# Patient Record
Sex: Male | Born: 1955 | Race: White | Hispanic: No | Marital: Married | Smoking: Former smoker
Health system: Southern US, Community
[De-identification: ages and names within clinical notes are randomized; demographics above are authoritative.]

## PROBLEM LIST (undated history)

## (undated) DIAGNOSIS — K429 Umbilical hernia without obstruction or gangrene: Secondary | ICD-10-CM

## (undated) DIAGNOSIS — Z9189 Other specified personal risk factors, not elsewhere classified: Secondary | ICD-10-CM

## (undated) DIAGNOSIS — K603 Anal fistula, unspecified: Secondary | ICD-10-CM

## (undated) DIAGNOSIS — K4020 Bilateral inguinal hernia, without obstruction or gangrene, not specified as recurrent: Secondary | ICD-10-CM

## (undated) DIAGNOSIS — I10 Essential (primary) hypertension: Secondary | ICD-10-CM

## (undated) HISTORY — PX: NASAL SINUS SURGERY: SHX719

## (undated) HISTORY — DX: Essential (primary) hypertension: I10

---

## 1998-01-02 ENCOUNTER — Encounter: Payer: Self-pay | Admitting: *Deleted

## 1998-01-02 ENCOUNTER — Inpatient Hospital Stay (HOSPITAL_COMMUNITY): Admission: EM | Admit: 1998-01-02 | Discharge: 1998-01-12 | Payer: Self-pay | Admitting: *Deleted

## 1998-01-03 ENCOUNTER — Encounter: Payer: Self-pay | Admitting: *Deleted

## 1998-01-04 ENCOUNTER — Encounter: Payer: Self-pay | Admitting: Surgery

## 1998-01-09 ENCOUNTER — Encounter: Payer: Self-pay | Admitting: *Deleted

## 1998-02-04 ENCOUNTER — Ambulatory Visit (HOSPITAL_COMMUNITY): Admission: RE | Admit: 1998-02-04 | Discharge: 1998-02-04 | Payer: Self-pay | Admitting: *Deleted

## 1998-02-04 ENCOUNTER — Encounter: Payer: Self-pay | Admitting: *Deleted

## 2007-01-22 HISTORY — PX: INCISION AND DRAINAGE PERIRECTAL ABSCESS: SHX1804

## 2013-04-14 ENCOUNTER — Encounter (HOSPITAL_BASED_OUTPATIENT_CLINIC_OR_DEPARTMENT_OTHER): Payer: Self-pay | Admitting: Emergency Medicine

## 2013-04-14 DIAGNOSIS — Z872 Personal history of diseases of the skin and subcutaneous tissue: Secondary | ICD-10-CM | POA: Insufficient documentation

## 2013-04-14 DIAGNOSIS — Z87891 Personal history of nicotine dependence: Secondary | ICD-10-CM | POA: Insufficient documentation

## 2013-04-14 DIAGNOSIS — I10 Essential (primary) hypertension: Secondary | ICD-10-CM | POA: Insufficient documentation

## 2013-04-14 DIAGNOSIS — IMO0002 Reserved for concepts with insufficient information to code with codable children: Secondary | ICD-10-CM | POA: Insufficient documentation

## 2013-04-14 DIAGNOSIS — F411 Generalized anxiety disorder: Secondary | ICD-10-CM | POA: Insufficient documentation

## 2013-04-14 DIAGNOSIS — R209 Unspecified disturbances of skin sensation: Secondary | ICD-10-CM | POA: Insufficient documentation

## 2013-04-14 LAB — URINALYSIS, ROUTINE W REFLEX MICROSCOPIC
Bilirubin Urine: NEGATIVE
Glucose, UA: NEGATIVE mg/dL
Hgb urine dipstick: NEGATIVE
Ketones, ur: 15 mg/dL — AB
Leukocytes, UA: NEGATIVE
Nitrite: NEGATIVE
Protein, ur: NEGATIVE mg/dL
Specific Gravity, Urine: 1.036 — ABNORMAL HIGH (ref 1.005–1.030)
Urobilinogen, UA: 0.2 mg/dL (ref 0.0–1.0)
pH: 5.5 (ref 5.0–8.0)

## 2013-04-14 NOTE — ED Notes (Signed)
Tingling and numbness in groin area and rectal pain x1 week.  Getting worse and lasting longer.  Denies any urinary sx.

## 2013-04-15 ENCOUNTER — Emergency Department (HOSPITAL_BASED_OUTPATIENT_CLINIC_OR_DEPARTMENT_OTHER)
Admission: EM | Admit: 2013-04-15 | Discharge: 2013-04-15 | Disposition: A | Discharge status: Home / Self Care | Payer: No Typology Code available for payment source | Attending: Emergency Medicine | Admitting: Emergency Medicine

## 2013-04-15 DIAGNOSIS — R202 Paresthesia of skin: Secondary | ICD-10-CM

## 2013-04-15 DIAGNOSIS — M5410 Radiculopathy, site unspecified: Secondary | ICD-10-CM

## 2013-04-15 MED ORDER — NAPROXEN 250 MG PO TABS
500.0000 mg | ORAL_TABLET | Freq: Two times a day (BID) | ORAL | Status: DC
  Administered 2013-04-15: 500 mg via ORAL
  Filled 2013-04-15: qty 2

## 2013-04-15 MED ORDER — NAPROXEN 500 MG PO TABS: 500.0000 mg | ORAL_TABLET | Freq: Two times a day (BID) | ORAL | Status: DC

## 2013-04-15 MED ORDER — METHYLPREDNISOLONE 4 MG PO KIT: PACK | ORAL | Status: DC

## 2013-04-15 NOTE — Discharge Instructions (Signed)
Paresthesia Paresthesia is an abnormal burning or prickling sensation. This sensation is generally felt in the hands, arms, legs, or feet. However, it may occur in any part of the body. It is usually not painful. The feeling may be described as:  Tingling or numbness.  "Pins and needles."  Skin crawling.  Buzzing.  Limbs "falling asleep."  Itching. Most people experience temporary (transient) paresthesia at some time in their lives. CAUSES  Paresthesia may occur when you breathe too quickly (hyperventilation). It can also occur without any apparent cause. Commonly, paresthesia occurs when pressure is placed on a nerve. The feeling quickly goes away once the pressure is removed. For some people, however, paresthesia is a long-lasting (chronic) condition caused by an underlying disorder. The underlying disorder may be:  A traumatic, direct injury to nerves. Examples include a:  Broken (fractured) neck.  Fractured skull.  A disorder affecting the brain and spinal cord (central nervous system). Examples include:  Transverse myelitis.  Encephalitis.  Transient ischemic attack.  Multiple sclerosis.  Stroke.  Tumor or blood vessel problems, such as an arteriovenous malformation pressing against the brain or spinal cord.  A condition that damages the peripheral nerves (peripheral neuropathy). Peripheral nerves are not part of the brain and spinal cord. These conditions include:  Diabetes.  Peripheral vascular disease.  Nerve entrapment syndromes, such as carpal tunnel syndrome.  Shingles.  Hypothyroidism.  Vitamin B12 deficiencies.  Alcoholism.  Heavy metal poisoning (lead, arsenic).  Rheumatoid arthritis.  Systemic lupus erythematosus. DIAGNOSIS  Your caregiver will attempt to find the underlying cause of your paresthesia. Your caregiver may:  Take your medical history.  Perform a physical exam.  Order various lab tests.  Order imaging tests. TREATMENT    Treatment for paresthesia depends on the underlying cause. HOME CARE INSTRUCTIONS  Avoid drinking alcohol.  You may consider massage or acupuncture to help relieve your symptoms.  Keep all follow-up appointments as directed by your caregiver. SEEK IMMEDIATE MEDICAL CARE IF:   You feel weak.  You have trouble walking or moving.  You have problems with speech or vision.  You feel confused.  You cannot control your bladder or bowel movements.  You feel numbness after an injury.  You faint.  Your burning or prickling feeling gets worse when walking.  You have pain, cramps, or dizziness.  You develop a rash. MAKE SURE YOU:  Understand these instructions.  Will watch your condition.  Will get help right away if you are not doing well or get worse. Document Released: 12/28/2001 Document Revised: 04/01/2011 Document Reviewed: 09/28/2010 [facility] Patient Information 2014 [place], [place].  Radicular Pain Radicular pain in either the arm or leg is usually from a bulging or herniated disk in the spine. A piece of the herniated disk may press against the nerves as the nerves exit the spine. This causes pain which is felt at the tips of the nerves down the arm or leg. Other causes of radicular pain may include:  Fractures.  Heart disease.  Cancer.  An abnormal and usually degenerative state of the nervous system or nerves (neuropathy). Diagnosis may require CT or MRI scanning to determine the primary cause.  Nerves that start at the neck (nerve roots) may cause radicular pain in the outer shoulder and arm. It can spread down to the thumb and fingers. The symptoms vary depending on which nerve root has been affected. In most cases radicular pain improves with conservative treatment. Neck problems may require physical therapy, a neck collar, or  cervical traction. Treatment may take many weeks, and surgery may be considered if the symptoms do not improve.  Conservative treatment  is also recommended for sciatica. Sciatica causes pain to radiate from the lower back or buttock area down the leg into the foot. Often there is a history of back problems. Most patients with sciatica are better after 2 to 4 weeks of rest and other supportive care. Short term bed rest can reduce the disk pressure considerably. Sitting, however, is not a good position since this increases the pressure on the disk. You should avoid bending, lifting, and all other activities which make the problem worse. Traction can be used in severe cases. Surgery is usually reserved for patients who do not improve within the first months of treatment. Only take over-the-counter or prescription medicines for pain, discomfort, or fever as directed by your caregiver. Narcotics and muscle relaxants may help by relieving more severe pain and spasm and by providing mild sedation. Cold or massage can give significant relief. Spinal manipulation is not recommended. It can increase the degree of disc protrusion. Epidural steroid injections are often effective treatment for radicular pain. These injections deliver medicine to the spinal nerve in the space between the protective covering of the spinal cord and back bones (vertebrae). Your caregiver can give you more information about steroid injections. These injections are most effective when given within two weeks of the onset of pain.  You should see your caregiver for follow up care as recommended. A program for neck and back injury rehabilitation with stretching and strengthening exercises is an important part of management.  SEEK IMMEDIATE MEDICAL CARE IF:  You develop increased pain, weakness, or numbness in your arm or leg.  You develop difficulty with bladder or bowel control.  You develop abdominal pain. Document Released: 02/15/2004 Document Revised: 04/01/2011 Document Reviewed: 05/02/2008 [facility] Patient Information 2014 [place], [place].   Emergency Department  Resource Guide 1) Find a Doctor and Pay Out of Pocket Although you won't have to find out who is covered by your insurance plan, it is a good idea to ask around and get recommendations. You will then need to call the office and see if the doctor you have chosen will accept you as a new patient and what types of options they offer for patients who are self-pay. Some doctors offer discounts or will set up payment plans for their patients who do not have insurance, but you will need to ask so you aren't surprised when you get to your appointment.  2) Contact Your Local Health Department Not all health departments have doctors that can see patients for sick visits, but many do, so it is worth a call to see if yours does. If you don't know where your local health department is, you can check in your phone book. The CDC also has a tool to help you locate your state's health department, and many state websites also have listings of all of their local health departments.  3) Find a Walk-in Clinic If your illness is not likely to be very severe or complicated, you may want to try a walk in clinic. These are popping up all over the country in pharmacies, drugstores, and shopping centers. They're usually staffed by nurse practitioners or physician assistants that have been trained to treat common illnesses and complaints. They're usually fairly quick and inexpensive. However, if you have serious medical issues or chronic medical problems, these are probably not your best option.  No Primary Care  Doctor: - Call Health Connect at  [phone] - they can help you locate a primary care doctor that  accepts your insurance, provides certain services, etc. - Physician Referral Service- [phone]  Chronic Pain Problems: Organization         Address  Phone   Notes  [facility]  [phone] Patients need to be referred by their primary care doctor.   Medication Assistance: Organization          Address  Phone   Notes  [facility] Medication [facility] [address] [place] [place], [place] [address] [phone] --Must be a resident of [facility] -- Must have NO insurance coverage whatsoever (no Medicaid/ Medicare, etc.) -- The pt. MUST have a primary care doctor that directs their care regularly and follows them in the community   [facility]  [phone]   [employer]  [phone]    Agencies that provide inexpensive medical care: Organization         Address  Phone   Notes  [facility]  [phone]   [facility]    [phone]   [facility] [address] [place], [place] [address] [phone]   [facility] [address] [place], [place] [phone]   Planned Parenthood    [phone]   [facility]    [phone]   [facility]  [address], [place] Phone:  [phone], Fax:  [phone] Hours of Operation:  9 am - 6 pm, M-F.  Also accepts Medicaid/Medicare and self-pay.  [facility]  [address], [place] Phone: [phone], Fax: [phone]. Hours of Operation:  8:30 am - 5:30 pm, M-F.  Also accepts Medicaid and self-pay.  [facility] [place] [address], [place] Phone: [phone]   [facility] [address] Natasha Bence [place], [place] [phone], Ext. [phone] Mondays & Thursdays: 7-9 AM.  First 15 patients are seen on a first come, first serve basis.    Medicaid-accepting [facility] Providers:  Organization         Address  Phone   Notes  [facility] [address], [place] [phone] Also accepts self-pay patients.  [facility] [address] Laurell Josephs [place], [place]  [phone]   [facility] [address], [place] [phone]   [facility] [address] [place] [phone]   Renaye Rakers [address], [place]   [phone] Only accepts [place] patients after they have their name applied to their card.   Self-Pay (no insurance) in [facility]:  Organization         Address  Phone   Notes  Sickle Cell Patients, [facility] [address] [place], [place] [phone]   [facility] [address] [place], [place] [phone]   [facility] [place]  [address],  [phone]   [facility]/Dr. Osei-Bonsu  [address], [place] or [address], [place] [phone] Phone number for both [place] and [place] locations is the same.  [facility] [address], [place] Otto [phone]   [facility] [address], [place] or [address] [phone]   [facility] [address], [place] [phone], phone; [phone], fax Sees patients 1st and 3rd Saturday of every month.  Must not qualify for public or private insurance (i.e. Medicaid, Medicare, [place] Health Choice, Veterans' Benefits)  Household income should be no more than 200% of the poverty level The clinic cannot treat you if you are pregnant or think you are pregnant  Sexually transmitted diseases are not treated at the clinic.    Dental Care: Organization         Address  Phone  Notes  [facility] of [facility] [address] [place], [place] [phone] Accepts children up to age 60 who are enrolled in [place] or [place] Health Choice; pregnant women with a Medicaid card; and children who have applied for Medicaid or [place] Health Choice, but were declined, whose parents can pay a reduced fee at time of service.  [facility] Department of [facility]  [address], [place] [phone] Accepts children up to age 33 who are enrolled in [place] or [place] Health Choice; pregnant women with a Medicaid card; and children who have applied for Medicaid or [place] Health Choice, but were declined, whose parents can pay a reduced fee at time of service.  [facility]  [address] [place], [place] [phone] Patients are seen by appointment only. Walk-ins are not accepted. [facility] will see patients 23 years of age and older. Monday - Tuesday (8am-5pm) Most Wednesdays (8:30-5pm) $30 per visit, cash only  [facility] Adult Dental Access [facility]  [address], [facility] [phone] Patients are seen by appointment only. Walk-ins are not accepted. [facility] will see patients 43 years of age and older. One Wednesday Evening (Monthly: Volunteer Based).  $30 per visit, cash only  [employer]  [phone] for adults; Children under age 24, call Graduate Pediatric Dentistry at [phone]. Children aged 58-14, please call [phone] to request a pediatric application.  Dental services are provided in all areas of dental care including fillings, crowns and bridges, complete and partial dentures, implants, gum treatment, root canals, and extractions. Preventive care is also provided. Treatment is provided to both adults and children. Patients are selected via a lottery and there is often a waiting list.   [facility] [address] [place]  [phone] [email or web address]   [facility] [address] [place], [place] [phone], Ext. [phone] Second and Fourth Thursday of each month, opens at 6:30 AM; Clinic ends at 9 AM.  Patients are seen on a first-come first-served basis, and a limited number are seen during each clinic.   [facility]  [address] Ether Griffins [place], [place] [phone]   Eligibility Requirements You must  have lived in [place], [place], or [place] counties for at least the last three months.   You cannot be eligible for state or federal sponsored [place], including [employer], [place], or [employer].   You generally cannot be eligible for healthcare insurance through your employer.    How to apply: Eligibility screenings are held every Tuesday and Wednesday afternoon from 1:00 pm until 4:00 pm. You do not need an appointment for the interview!  [facility] [address] [place], [place] [phone]   Aaron Edelman  [facility] Health Department  [phone]   [facility]  [phone]   [facility]  [phone]    Behavioral Health Resources in the Community: Intensive Outpatient Programs Organization         Address  Phone  Notes  [facility] [address], [place], [place] [phone]   [facility] [address], [place], [place] [phone]   [facility] [address], [place], [place]  [phone]   [facility] [address],  [place], [place] [phone] or [phone]   Substance Abuse Resources Organization         Address  Phone  Notes  [facility]  [phone]   [facility]  [phone]   [facility]  [phone]   [facility]  [phone]   Residential & Outpatient Substance Abuse Program  [phone]   Psychological Services Organization         Address  Phone  Notes  [facility]  [phone]- [phone]   [facility]  [phone]- [phone]   [facility] [address], [place] [phone] or [phone]    Mobile Crisis Teams Organization         Address  Phone  Notes  [facility], Mobile Crisis Care Unit  [phone]   [facility]  [address] [place], [place] [phone]   Doristine LocksSharon  DeEsch [address] [place] [phone]    Self-Help/Support Groups Organization         Address  Phone             Notes  [facility] [place] - variety of support groups  336- [phone] Call for more information  Narcotics Anonymous (NA), [facility] [address], [place]  2 meetings at this location   Statisticianesidential Treatment Programs Organization         Address  Phone  Notes  [facility] [address],    [place]  [phone]   [facility]  [address], [place] [phone], [place], [place] [phone]   [facility] [address], [place] [phone] Admissions: 8am-3pm M-F  [facility] [address]    [place], [place] [phone]   [facility] [address], [place], [place] [phone]   [facility] [address]  [place], [place] [phone]   [facility] - Intensive Outpatient [address], [place], [place] [phone]   [facility] ([facility] [address]  [place], [place] [phone] or [phone]   [facility] ([facility]) [address] [place], [place] [phone] Accepts Medicaid  [facility] [address]  [place] [phone] Substance Abuse/Addiction Treatment   [facility] Resources Organization         Address  Phone  Notes  [facility]  [phone]) [phone]   Angie FavaJulie Brannon, PhD [address], [address] [place], [place]   [phone] or [phone]   [facility]    [address] [place], [place] [phone](336) [phone]   [facility] [address], [place], [place] [phone](336) [phone] Insurance/Medicaid/sponsorship through [employer] [facility]  [facility] Families [address]  [place], [place] [phone] [place], [place] [phone]    Dr. Adele Schilder  [phone]   [facility] Dept. 1) [address] [place] 2) [place] 3)  [address], [place] [phone]  [phone]   [place] [phone] or [phone] (After Hours)

## 2013-04-15 NOTE — ED Provider Notes (Signed)
CSN: [id number]     Arrival date & time 04/14/13  2315 History   First MD Initiated Contact with Patient 04/15/13 0134     Chief Complaint  Patient presents with  . Groin Pain     (Consider location/radiation/quality/duration/timing/severity/associated sxs/prior Treatment) HPI 58 year old male presents to emergency department with complaint of a tingling sensation in his right groin, extending down his right leg, occasionally in his penis and scrotal area.  Sensation is a buzzing electrical numbness.  He occasionally has some pain associated with sensation.  Sensation occasionally comes from his back, radiates around and goes down into his groin and into his leg.  Patient also reports occasional rectal drainage of grayish white material.  He reports past history of a buttock abscess and perirectal abscess.  He reports that occasionally he will have drainage from the area of the previous abscess.  He denies any pain associated with this drainage.  No fevers or chills.  Patient does not have a primary care Dr. It is his birthday, and he was concerned about the possibility of having cancer as cause for his symptoms.  He was also worried that the abscess may be closed off and backing up inside his body, causing him to become ill.  No fever, chills.  Patient reports generalized bloating as well.  Patient is overall poor historian, is unable to pinpoint when the electrical sensation began, but he estimates 2-3 weeks ago.  He has not taken any medication prior to arrival.  The pain is worse with sitting for prolonged periods.  He denies any weakness in his right or left leg. History reviewed. No pertinent past medical history. History reviewed. No pertinent past surgical history. No family history on file. History  Substance Use Topics  . Smoking status: Former Games developermoker  . Smokeless tobacco: Never Used  . Alcohol Use: No    Review of Systems   See History of Present Illness; otherwise all other  systems are reviewed and negative  Allergies  Review of patient's allergies indicates no known allergies.  Home Medications  No current outpatient prescriptions on file. BP 162/103  Pulse 104  Temp(Src) 98.4 F (36.9 C) (Oral)  Resp 20  Ht 5\' 8"  (1.727 m)  Wt 210 lb (95.255 kg)  BMI 31.94 kg/m2  SpO2 94% Physical Exam  Nursing note and vitals reviewed. Constitutional: He is oriented to person, place, and time. He appears well-developed and well-nourished. He appears distressed (Anxious appearing).  HENT:  Head: Normocephalic and atraumatic.  Nose: Nose normal.  Mouth/Throat: Oropharynx is clear and moist.  Eyes: Conjunctivae and EOM are normal. Pupils are equal, round, and reactive to light.  Neck: Normal range of motion. Neck supple. No JVD present. No tracheal deviation present. No thyromegaly present.  Cardiovascular: Normal rate, regular rhythm, normal heart sounds and intact distal pulses.  Exam reveals no gallop and no friction rub.   No murmur heard. Hypertension noted  Pulmonary/Chest: Effort normal and breath sounds normal. No stridor. No respiratory distress. He has no wheezes. He has no rales. He exhibits no tenderness.  Abdominal: Soft. Bowel sounds are normal. He exhibits no distension and no mass. There is no tenderness. There is no rebound and no guarding.  Genitourinary: No penile tenderness.  No inguinal hernias noted.  Testicles are normal.  Penis is normal.  Exam performed, no perirectal or perianal abscess is noted, no abscesses, prostate is normal.  Musculoskeletal: Normal range of motion. He exhibits no edema and no tenderness.  Lymphadenopathy:  He has no cervical adenopathy.  Neurological: He is alert and oriented to person, place, and time. He has normal reflexes. No cranial nerve deficit. He exhibits normal muscle tone. Coordination normal.  Skin: Skin is warm and dry. No rash noted. No erythema. No pallor.  Psychiatric: He has a normal mood and  affect. His behavior is normal. Judgment and thought content normal.    ED Course  Procedures (including critical care time) Labs Review Labs Reviewed  URINALYSIS, ROUTINE W REFLEX MICROSCOPIC - Abnormal; Notable for the following:    Color, Urine AMBER (*)    Specific Gravity, Urine 1.036 (*)    Ketones, ur 15 (*)    All other components within normal limits   Imaging Review No results found.   EKG Interpretation None      MDM   Final diagnoses:  Radiculopathy  Paresthesia    58 year old male with possible radiculopathy as explanation for the sensation that he has in his right groin, extending down his right leg.  His exam is overall normal.  I do not see any active source of rectal or anal drainage or abscess.  Will have him followup with general surgery for further evaluation of his ongoing drainage.  We'll set him up to see a primary care Dr.  Roosvelt Harps to do a Medrol Dosepak and Naprosyn to help with his paresthesias    Olivia Mackie, MD 04/15/13 [id number]

## 2013-04-16 ENCOUNTER — Ambulatory Visit (INDEPENDENT_AMBULATORY_CARE_PROVIDER_SITE_OTHER): Payer: No Typology Code available for payment source | Admitting: Surgery

## 2013-04-16 ENCOUNTER — Encounter (INDEPENDENT_AMBULATORY_CARE_PROVIDER_SITE_OTHER): Payer: Self-pay | Admitting: Surgery

## 2013-04-16 VITALS — BP 170/76 | HR 80 | Temp 97.6°F | Resp 16 | Ht 69.0 in | Wt 215.0 lb

## 2013-04-16 DIAGNOSIS — K4020 Bilateral inguinal hernia, without obstruction or gangrene, not specified as recurrent: Secondary | ICD-10-CM | POA: Insufficient documentation

## 2013-04-16 DIAGNOSIS — K429 Umbilical hernia without obstruction or gangrene: Secondary | ICD-10-CM

## 2013-04-16 DIAGNOSIS — K603 Anal fistula: Secondary | ICD-10-CM

## 2013-04-16 NOTE — Patient Instructions (Signed)
We will schedule you to see Dr. 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  After that problem has resolved, I would like to see you back about your three hernias.

## 2013-04-16 NOTE — Progress Notes (Signed)
Patient ID: Daniel [name], male   DOB: [birth year], 58 y.o.   MRN: [id number]  Chief Complaint  Patient presents with  . Abscess    HPI Daniel [name] is a 58 y.o. male.  Referred by Dr. Norlene Campbelltter for evaluation of perirectal abscess/ fistula Abscess Associated symptoms: no fever, no headaches, no nausea and no vomiting    This is a 58 year old male who had a pararectal abscess drained in 2009 by Dr. Dwain SarnaWakefield. Over the last several months he has noticed some purulent drainage and occasional irritation and tenderness around his rectum.  He does not have a primary care physician and he went to the emergency department yesterday with complaints of intermittent twinges of pain in both groins which extended down into the testicles and penis. He denies any significant pain around his rectum at this time. He has not noticed any swelling or bulges in his groin. He was evaluated in the emergency department and was felt to have a possible radiculopathy. He was given a Medrol Dosepak.  He was asked to followup in our office for the rectal drainage.  The patient reports frequent constipation.    History reviewed. No pertinent past medical history.  History reviewed. No pertinent past surgical history.  Family History  Problem Relation Age of Onset  . Cancer Father     liver    Social History History  Substance Use Topics  . Smoking status: Former Games developermoker  . Smokeless tobacco: Never Used  . Alcohol Use: No    No Known Allergies  Current Outpatient Prescriptions  Medication Sig Dispense Refill  . methylPREDNISolone (MEDROL DOSEPAK) 4 MG tablet Take as directed on packet  21 tablet  0  . naproxen (NAPROSYN) 500 MG tablet Take 1 tablet (500 mg total) by mouth 2 (two) times daily.  30 tablet  0   No current facility-administered medications for this visit.    Review of Systems Review of Systems  Constitutional: Negative for fever, chills and unexpected weight change.  HENT: Negative for  congestion, hearing loss, sore throat, trouble swallowing and voice change.   Eyes: Negative for visual disturbance.  Respiratory: Negative for cough and wheezing.   Cardiovascular: Negative for chest pain, palpitations and leg swelling.  Gastrointestinal: Positive for constipation and rectal pain. Negative for nausea, vomiting, abdominal pain, diarrhea, blood in stool, abdominal distention and anal bleeding.  Genitourinary: Positive for testicular pain. Negative for hematuria and difficulty urinating.  Musculoskeletal: Negative for arthralgias.  Skin: Negative for rash and wound.  Neurological: Negative for seizures, syncope, weakness and headaches.  Hematological: Negative for adenopathy. Does not bruise/bleed easily.  Psychiatric/Behavioral: Negative for confusion.    Blood pressure 170/76, pulse 80, temperature 97.6 F (36.4 C), temperature source Oral, resp. rate 16, height 5\' 9"  (1.753 m), weight 215 lb (97.523 kg).  Physical Exam Physical Exam WDWN in NAD HEENT:  EOMI, sclera anicteric Neck:  No masses, no thyromegaly Lungs:  CTA bilaterally; normal respiratory effort CV:  Regular rate and rhythm; no murmurs Abd:  +bowel sounds, soft, non-tender, protruding umbilical hernia - reducible GU:  Bilateral descended testes; no testicular masses; small bilateral inguinal hernias - reducible Rectal - purulent drainage noted; multiple small skin openings identified, but it is unclear which of these is draining the purulent fluid.  No masses on digital rectal examination. Ext:  Well-perfused; no edema Skin:  Warm, dry; no sign of jaundice  Data Reviewed none  Assessment    1.  Umbilical hernia 2.  Small minimally symptomatic bilateral inguinal hernias 3.  Chronic perianal fistulas     Plan    We will ask Dr. Romie Levee to evaluate the patient regarding these chronic fistulas.  It is unclear whether he might have inflammatory bowel disease or if these fistulas need to be  explored in the operating room.  When that problem has been addressed and resolved, he will come back to see me regarding his hernias.        Daniel [name]. 04/16/2013, 9:41 PM

## 2013-05-04 ENCOUNTER — Ambulatory Visit (INDEPENDENT_AMBULATORY_CARE_PROVIDER_SITE_OTHER): Payer: No Typology Code available for payment source | Admitting: General Surgery

## 2013-05-04 ENCOUNTER — Encounter (INDEPENDENT_AMBULATORY_CARE_PROVIDER_SITE_OTHER): Payer: Self-pay | Admitting: General Surgery

## 2013-05-04 VITALS — BP 120/80 | HR 120 | Temp 98.0°F | Resp 14 | Ht 68.0 in | Wt 206.4 lb

## 2013-05-04 DIAGNOSIS — K603 Anal fistula: Secondary | ICD-10-CM

## 2013-05-04 NOTE — Patient Instructions (Signed)

## 2013-05-04 NOTE — Progress Notes (Signed)
Chief Complaint  Patient presents with  . New Evaluation    eval for rectal drainage hx of rectal abscess    HISTORY: Daniel [name] is a 58 y.o. male who presents to the office with rectal drainage.  Other symptoms include constipation.  This had been occurring since 2009 after a perianal abscess.    It is intermittent in nature.  His bowel habits are irregular and his bowel movements are usually soft.  His fiber intake is minimal.  His last colonoscopy was in 2002 and is scheduled for a repeat 4/22.    Past Medical History  Diagnosis Date  . Hypertension       Past Surgical History  Procedure Laterality Date  . Perianal abscess  2009    Dr. Dwain SarnaWakefield removed        Current Outpatient Prescriptions  Medication Sig Dispense Refill  . lisinopril (PRINIVIL,ZESTRIL) 10 MG tablet Take 10 mg by mouth daily.       No current facility-administered medications for this visit.      Allergies  Allergen Reactions  . Prednisone Other (See Comments)    Rectal bleeding along with costipation      Family History  Problem Relation Age of Onset  . Cancer Father     liver    History   Social History  . Marital Status: Married    Spouse Name: N/A    Number of Children: N/A  . Years of Education: N/A   Social History Main Topics  . Smoking status: Former Games developermoker  . Smokeless tobacco: Never Used  . Alcohol Use: No  . Drug Use: No  . Sexual Activity: None   Other Topics Concern  . None   Social History Narrative  . None      REVIEW OF SYSTEMS - PERTINENT POSITIVES ONLY: Review of Systems - General ROS: negative for - chills, fever or weight loss Hematological and Lymphatic ROS: negative for - bleeding problems, blood clots or bruising Respiratory ROS: no cough, shortness of breath, or wheezing Cardiovascular ROS: no chest pain or dyspnea on exertion Gastrointestinal ROS: positive for - abdominal pain negative for - blood in stools, change in bowel habits or  nausea/vomiting Genito-Urinary ROS: no dysuria, trouble voiding, or hematuria  EXAM: Filed Vitals:   05/04/13 1115  BP: 120/80  Pulse: 120  Temp: 98 F (36.7 C)  Resp: 14    General appearance: alert and cooperative Resp: clear to auscultation bilaterally Cardio: regular rate and rhythm GI: normal findings: soft, non-tender   Procedure: Anoscopy Surgeon: Maisie Fushomas Diagnosis: rectal drainage  Assistant: Molli PoseyKendrickson After the risks and benefits were explained, verbal consent was obtained for above procedure  Anesthesia: none Findings: inflamed anal canal, mucus filled stool in rectal vault, L lateral perianal purulent drainage noted, palpable fluctuant mass at anal verge    ASSESSMENT AND PLAN:  Daniel LawsJames W Riano Is a [age] year old male who presents to the office with approximately 7 years of intermittent rectal drainage. Physical exam indicates an anal fistula. He has had a pelvic MRI performed for this. We will get these results from Dr. Wynelle BeckmannGillis. He is also scheduled to have a colonoscopy done next week, which I think is a good idea given his anal canal inflammation. His anorectal exam is concerning for possible perianal Crohn's disease. I've recommended an exam under anesthesia with possible biopsy and possible fistulotomy versus seton placement.  I've asked them to have Dr. Wynelle BeckmannGillis send us a copy of the colonoscopy report as well.  Vanita PandaAlicia C Braydyn Schultes, MD Colon and Rectal Surgery / General Surgery [facility]      Visit Diagnoses: 1. Anal fistula     Primary Care Physician: No PCP Per Patient

## 2013-05-12 HISTORY — PX: COLONOSCOPY WITH PROPOFOL: SHX5780

## 2013-05-17 ENCOUNTER — Encounter (HOSPITAL_BASED_OUTPATIENT_CLINIC_OR_DEPARTMENT_OTHER): Payer: Self-pay | Admitting: *Deleted

## 2013-05-18 ENCOUNTER — Encounter (HOSPITAL_BASED_OUTPATIENT_CLINIC_OR_DEPARTMENT_OTHER): Payer: Self-pay | Admitting: *Deleted

## 2013-05-18 NOTE — Progress Notes (Signed)
05/18/13 0935  OBSTRUCTIVE SLEEP APNEA  Have you ever been diagnosed with sleep apnea through a sleep study? No  Do you snore loudly (loud enough to be heard through closed doors)?  1  Do you often feel tired, fatigued, or sleepy during the daytime? 0  Has anyone observed you stop breathing during your sleep? 1  Do you have, or are you being treated for high blood pressure? 1  BMI more than 35 kg/m2? 0  Age over 57 years old? 1  Neck circumference greater than 40 cm/16 inches? 0  Gender: 1  Obstructive Sleep Apnea Score 5  Score 4 or greater  Results sent to PCP

## 2013-05-18 NOTE — Progress Notes (Signed)
NPO AFTER MN WITH EXCEPTION CLEAR LIQUIDS UNTIL 0800 (NO CREAM/ MILK PRODUCTS).  ARRIVE AT 1245. NEEDS ISTAT AND EKG.  

## 2013-05-19 ENCOUNTER — Encounter (HOSPITAL_BASED_OUTPATIENT_CLINIC_OR_DEPARTMENT_OTHER)
Admission: RE | Disposition: A | Discharge status: Home / Self Care | Payer: Self-pay | Source: Ambulatory Visit | Attending: General Surgery

## 2013-05-19 ENCOUNTER — Encounter (HOSPITAL_BASED_OUTPATIENT_CLINIC_OR_DEPARTMENT_OTHER): Payer: Self-pay | Admitting: *Deleted

## 2013-05-19 ENCOUNTER — Ambulatory Visit (HOSPITAL_BASED_OUTPATIENT_CLINIC_OR_DEPARTMENT_OTHER): Payer: No Typology Code available for payment source | Admitting: Anesthesiology

## 2013-05-19 ENCOUNTER — Ambulatory Visit (HOSPITAL_BASED_OUTPATIENT_CLINIC_OR_DEPARTMENT_OTHER)
Admission: RE | Admit: 2013-05-19 | Discharge: 2013-05-19 | Disposition: A | Discharge status: Home / Self Care | Payer: No Typology Code available for payment source | Source: Ambulatory Visit | Attending: General Surgery | Admitting: General Surgery

## 2013-05-19 ENCOUNTER — Encounter (HOSPITAL_BASED_OUTPATIENT_CLINIC_OR_DEPARTMENT_OTHER): Payer: No Typology Code available for payment source | Admitting: Anesthesiology

## 2013-05-19 DIAGNOSIS — K603 Anal fistula, unspecified: Secondary | ICD-10-CM | POA: Insufficient documentation

## 2013-05-19 DIAGNOSIS — Z79899 Other long term (current) drug therapy: Secondary | ICD-10-CM | POA: Insufficient documentation

## 2013-05-19 DIAGNOSIS — Z87891 Personal history of nicotine dependence: Secondary | ICD-10-CM | POA: Insufficient documentation

## 2013-05-19 DIAGNOSIS — I10 Essential (primary) hypertension: Secondary | ICD-10-CM | POA: Insufficient documentation

## 2013-05-19 HISTORY — DX: Anal fistula: K60.3

## 2013-05-19 HISTORY — DX: Other specified personal risk factors, not elsewhere classified: Z91.89

## 2013-05-19 HISTORY — DX: Anal fistula, unspecified: K60.30

## 2013-05-19 HISTORY — DX: Umbilical hernia without obstruction or gangrene: K42.9

## 2013-05-19 HISTORY — PX: RECTAL EXAM UNDER ANESTHESIA: SHX6399

## 2013-05-19 HISTORY — PX: PLACEMENT OF SETON: SHX6029

## 2013-05-19 HISTORY — DX: Bilateral inguinal hernia, without obstruction or gangrene, not specified as recurrent: K40.20

## 2013-05-19 LAB — POCT I-STAT 4, (NA,K, GLUC, HGB,HCT)
Glucose, Bld: 91 mg/dL (ref 70–99)
HCT: 47 % (ref 39.0–52.0)
Hemoglobin: 16 g/dL (ref 13.0–17.0)
Potassium: 3.8 mEq/L (ref 3.7–5.3)
SODIUM: 143 meq/L (ref 137–147)

## 2013-05-19 SURGERY — EXAM UNDER ANESTHESIA, RECTUM
Anesthesia: Monitor Anesthesia Care | Site: Rectum

## 2013-05-19 MED ORDER — OXYCODONE HCL 5 MG PO TABS
5.0000 mg | ORAL_TABLET | ORAL | Status: DC | PRN
  Filled 2013-05-19: qty 2

## 2013-05-19 MED ORDER — FENTANYL CITRATE 0.05 MG/ML IJ SOLN
25.0000 ug | INTRAMUSCULAR | Status: DC | PRN
Start: 2013-05-19 — End: 2013-05-19
  Filled 2013-05-19: qty 1

## 2013-05-19 MED ORDER — SODIUM CHLORIDE 0.9 % IJ SOLN
3.0000 mL | INTRAMUSCULAR | Status: DC | PRN
  Filled 2013-05-19: qty 3

## 2013-05-19 MED ORDER — ACETAMINOPHEN 650 MG RE SUPP
650.0000 mg | RECTAL | Status: DC | PRN
  Filled 2013-05-19: qty 1

## 2013-05-19 MED ORDER — ACETAMINOPHEN 325 MG PO TABS
650.0000 mg | ORAL_TABLET | ORAL | Status: DC | PRN
  Filled 2013-05-19: qty 2

## 2013-05-19 MED ORDER — KETAMINE HCL 50 MG/ML IJ SOLN
INTRAMUSCULAR | Status: AC
Start: 2013-05-19 — End: 2013-05-19
  Filled 2013-05-19: qty 10

## 2013-05-19 MED ORDER — PROMETHAZINE HCL 25 MG/ML IJ SOLN
6.2500 mg | INTRAMUSCULAR | Status: DC | PRN
  Filled 2013-05-19: qty 1

## 2013-05-19 MED ORDER — 0.9 % SODIUM CHLORIDE (POUR BTL) OPTIME
TOPICAL | Status: DC | PRN
  Administered 2013-05-19: 500 mL

## 2013-05-19 MED ORDER — LACTATED RINGERS IV SOLN
INTRAVENOUS | Status: DC
  Filled 2013-05-19: qty 1000

## 2013-05-19 MED ORDER — PROPOFOL 10 MG/ML IV EMUL
INTRAVENOUS | Status: DC | PRN
  Administered 2013-05-19: 140 ug/kg/min via INTRAVENOUS

## 2013-05-19 MED ORDER — SODIUM CHLORIDE 0.9 % IJ SOLN
3.0000 mL | Freq: Two times a day (BID) | INTRAMUSCULAR | Status: DC
  Filled 2013-05-19: qty 3

## 2013-05-19 MED ORDER — SODIUM CHLORIDE 0.9 % IV SOLN
250.0000 mg | INTRAVENOUS | Status: DC | PRN
  Administered 2013-05-19: 10 ug/kg/min via INTRAVENOUS

## 2013-05-19 MED ORDER — FENTANYL CITRATE 0.05 MG/ML IJ SOLN
INTRAMUSCULAR | Status: AC
  Filled 2013-05-19: qty 6

## 2013-05-19 MED ORDER — LIDOCAINE 5 % EX OINT
TOPICAL_OINTMENT | CUTANEOUS | Status: DC | PRN
  Administered 2013-05-19: 1

## 2013-05-19 MED ORDER — MIDAZOLAM HCL 5 MG/5ML IJ SOLN
INTRAMUSCULAR | Status: DC | PRN
  Administered 2013-05-19: 2 mg via INTRAVENOUS

## 2013-05-19 MED ORDER — LACTATED RINGERS IV SOLN
INTRAVENOUS | Status: DC
Start: 2013-05-19 — End: 2013-05-19
  Administered 2013-05-19: 13:00:00 via INTRAVENOUS
  Filled 2013-05-19: qty 1000

## 2013-05-19 MED ORDER — MIDAZOLAM HCL 2 MG/2ML IJ SOLN
INTRAMUSCULAR | Status: AC
  Filled 2013-05-19: qty 2

## 2013-05-19 MED ORDER — SODIUM CHLORIDE 0.9 % IV SOLN
250.0000 mL | INTRAVENOUS | Status: DC | PRN
  Filled 2013-05-19: qty 250

## 2013-05-19 MED ORDER — DOCUSATE SODIUM 100 MG PO CAPS: 100.0000 mg | ORAL_CAPSULE | Freq: Two times a day (BID) | ORAL | Status: DC

## 2013-05-19 MED ORDER — BUPIVACAINE-EPINEPHRINE 0.5% -1:200000 IJ SOLN
INTRAMUSCULAR | Status: DC | PRN
  Administered 2013-05-19: 20 mL

## 2013-05-19 MED ORDER — OXYCODONE HCL 5 MG PO TABS: 5.0000 mg | ORAL_TABLET | ORAL | Status: DC | PRN

## 2013-05-19 MED ORDER — FENTANYL CITRATE 0.05 MG/ML IJ SOLN
INTRAMUSCULAR | Status: DC | PRN
  Administered 2013-05-19: 50 ug via INTRAVENOUS

## 2013-05-19 MED ORDER — MEPERIDINE HCL 25 MG/ML IJ SOLN
6.2500 mg | INTRAMUSCULAR | Status: DC | PRN
  Filled 2013-05-19: qty 1

## 2013-05-19 SURGICAL SUPPLY — 52 items
APL SKNCLS STERI-STRIP NONHPOA (GAUZE/BANDAGES/DRESSINGS) ×1
BENZOIN TINCTURE PRP APPL 2/3 (GAUZE/BANDAGES/DRESSINGS) ×3 IMPLANT
BLADE HEX COATED 2.75 (ELECTRODE) IMPLANT
BLADE SURG 10 STRL SS (BLADE) IMPLANT
BLADE SURG 15 STRL LF DISP TIS (BLADE) ×1 IMPLANT
BLADE SURG 15 STRL SS (BLADE) ×3
BRIEF STRETCH FOR OB PAD LRG (UNDERPADS AND DIAPERS) IMPLANT
CANISTER SUCTION 2500CC (MISCELLANEOUS) ×3 IMPLANT
CLOTH BEACON ORANGE TIMEOUT ST (SAFETY) IMPLANT
COVER MAYO STAND STRL (DRAPES) ×3 IMPLANT
COVER TABLE BACK 60X90 (DRAPES) ×3 IMPLANT
DECANTER SPIKE VIAL GLASS SM (MISCELLANEOUS) IMPLANT
DRAPE LG THREE QUARTER DISP (DRAPES) IMPLANT
DRAPE PED LAPAROTOMY (DRAPES) ×3 IMPLANT
DRAPE UNDERBUTTOCKS STRL (DRAPE) IMPLANT
ELECT BLADE 6.5 .24CM SHAFT (ELECTRODE) IMPLANT
ELECT REM PT RETURN 9FT ADLT (ELECTROSURGICAL) ×3
ELECTRODE REM PT RTRN 9FT ADLT (ELECTROSURGICAL) ×1 IMPLANT
GAUZE SPONGE 4X4 16PLY XRAY LF (GAUZE/BANDAGES/DRESSINGS) IMPLANT
GAUZE VASELINE 3X9 (GAUZE/BANDAGES/DRESSINGS) IMPLANT
GLOVE BIO SURGEON STRL SZ 6.5 (GLOVE) ×4 IMPLANT
GLOVE BIO SURGEONS STRL SZ 6.5 (GLOVE) ×2
GLOVE INDICATOR 7.0 STRL GRN (GLOVE) ×6 IMPLANT
GOWN PREVENTION PLUS LG XLONG (DISPOSABLE) ×3 IMPLANT
GOWN PREVENTION PLUS XLARGE (GOWN DISPOSABLE) IMPLANT
GOWN STRL REIN XL XLG (GOWN DISPOSABLE) IMPLANT
GOWN STRL REUS W/TWL 2XL LVL3 (GOWN DISPOSABLE) ×3 IMPLANT
LEGGING LITHOTOMY PAIR STRL (DRAPES) IMPLANT
LOOP VESSEL MAXI BLUE (MISCELLANEOUS) IMPLANT
NDL SAFETY ECLIPSE 18X1.5 (NEEDLE) IMPLANT
NEEDLE HYPO 18GX1.5 SHARP (NEEDLE)
NEEDLE HYPO 25X1 1.5 SAFETY (NEEDLE) ×3 IMPLANT
NS IRRIG 500ML POUR BTL (IV SOLUTION) ×3 IMPLANT
PACK BASIN DAY SURGERY FS (CUSTOM PROCEDURE TRAY) ×3 IMPLANT
PAD ABD 8X10 STRL (GAUZE/BANDAGES/DRESSINGS) ×3 IMPLANT
PENCIL BUTTON HOLSTER BLD 10FT (ELECTRODE) ×3 IMPLANT
SPONGE GAUZE 4X4 12PLY (GAUZE/BANDAGES/DRESSINGS) IMPLANT
SPONGE SURGIFOAM ABS GEL 12-7 (HEMOSTASIS) IMPLANT
SUT CHROMIC 2 0 SH (SUTURE) ×3 IMPLANT
SUT CHROMIC 3 0 SH 27 (SUTURE) IMPLANT
SUT ETHIBOND 0 (SUTURE) IMPLANT
SUT GUT CHROMIC 3 0 (SUTURE) IMPLANT
SUT MON AB 3-0 SH 27 (SUTURE)
SUT MON AB 3-0 SH27 (SUTURE) IMPLANT
SUT VIC AB 4-0 P-3 18XBRD (SUTURE) IMPLANT
SUT VIC AB 4-0 P3 18 (SUTURE)
SYR CONTROL 10ML LL (SYRINGE) ×3 IMPLANT
TOWEL OR 17X24 6PK STRL BLUE (TOWEL DISPOSABLE) ×6 IMPLANT
TRAY DSU PREP LF (CUSTOM PROCEDURE TRAY) ×3 IMPLANT
TUBE CONNECTING 12'X1/4 (SUCTIONS) ×1
TUBE CONNECTING 12X1/4 (SUCTIONS) ×2 IMPLANT
YANKAUER SUCT BULB TIP NO VENT (SUCTIONS) ×3 IMPLANT

## 2013-05-19 NOTE — Transfer of Care (Signed)
Immediate Anesthesia Transfer of Care Note  Patient: Daniel LawsJames W Metter  Procedure(s) Performed: Procedure(s) (LRB): RECTAL EXAM UNDER ANESTHESIA  (N/A) anal fistulotmy  (N/A)  Patient Location: PACU  Anesthesia Type: General  Level of Consciousness: awake, oriented, sedated and patient cooperative  Airway & Oxygen Therapy: Patient Spontanous Breathing and Patient connected to face mask oxygen  Post-op Assessment: Report given to PACU RN and Post -op Vital signs reviewed and stable  Post vital signs: Reviewed and stable  Complications: No apparent anesthesia complications

## 2013-05-19 NOTE — Interval H&P Note (Signed)
History and Physical Interval Note:  05/19/2013 2:05 PM  Daniel LawsJames W Barcelo  has presented today for surgery, with the diagnosis of anal fistula   The various methods of treatment have been discussed with the patient and family. After consideration of risks, benefits and other options for treatment, the patient has consented to  Procedure(s): RECTAL EXAM UNDER ANESTHESIA  (N/A) possible PLACEMENT OF SETON  possible fistulotmy  (N/A) as a surgical intervention .  The patient's history has been reviewed, patient examined, no change in status, stable for surgery.  I have reviewed the patient's chart and labs.  Questions were answered to the patient's satisfaction.     Romie LeveeAlicia Aribella Vavra

## 2013-05-19 NOTE — Discharge Instructions (Addendum)
ANORECTAL SURGERY: POST OP INSTRUCTIONS °1. Take your usually prescribed home medications unless otherwise directed. °2. DIET: During the first few hours after surgery sip on some liquids until you are able to urinate.  It is normal to not urinate for several hours after this surgery.  If you feel uncomfortable, please contact the office for instructions.  After you are able to urinate,you may eat, if you feel like it.  Follow a light bland diet the first 24 hours after arrival home, such as soup, liquids, crackers, etc.  Be sure to include lots of fluids daily (6-8 glasses).  Avoid fast food or heavy meals, as your are more likely to get nauseated.  Eat a low fat diet the next few days after surgery.  Limit caffeine intake to 1-2 servings a day. °3. PAIN CONTROL: °a. Pain is best controlled by a usual combination of several different methods TOGETHER: °i. Muscle relaxation °1.  Soak in a warm bath (or Sitz bath) three times a day and after bowel movements.  Continue to do this until all pain is resolved. °ii. Over the counter pain medication °iii. Prescription pain medication °b. Most patients will experience some swelling and discomfort in the anus/rectal area and incisions.  Heat such as warm towels, sitz baths, warm baths, etc to help relax tight/sore spots and speed recovery.  Some people prefer to use ice, especially in the first couple days after surgery, as it may decrease the pain and swelling, or alternate between ice & heat.  Experiment to what works for you.  Swelling and bruising can take several weeks to resolve.  Pain can take even longer to completely resolve. °c. It is helpful to take an over-the-counter pain medication regularly for the first few weeks.  Choose one of the following that works best for you: °i. Naproxen (Aleve, etc)  Two 220mg tabs twice a day °ii. Ibuprofen (Advil, etc) Three 200mg tabs four times a day (every meal & bedtime) °d. A  prescription for pain medication (such as  percocet, oxycodone, hydrocodone, etc) should be given to you upon discharge.  Take your pain medication as prescribed.  °i. If you are having problems/concerns with the prescription medicine (does not control pain, nausea, vomiting, rash, itching, etc), please call us [phone] to see if we need to switch you to a different pain medicine that will work better for you and/or control your side effect better. °ii. If you need a refill on your pain medication, please contact your pharmacy.  They will contact our office to request authorization. Prescriptions will not be filled after 5 pm or on week-ends. °4. KEEP YOUR BOWELS REGULAR and AVOID CONSTIPATION °a. The goal is one to two soft bowel movements a day.  You should at least have a bowel movement every other day. °b. Avoid getting constipated.  Between the surgery and the pain medications, it is common to experience some constipation. This can be very painful after rectal surgery.  Increasing fluid intake and taking a fiber supplement (such as Metamucil, Citrucel, FiberCon, etc) 1-2 times a day regularly will usually help prevent this problem from occurring.  A stool softener like colace is also recommended.  This can be purchased over the counter at your pharmacy.  You can take it up to 3 times a day.  If you do not have a bowel movement after 24 hrs since your surgery, take one does of milk of magnesia.  If you still haven't had a bowel movement 8-12   hours after that dose, take another dose.  If you don't have a bowel movement 48 hrs after surgery, purchase a Fleets enema from the drug store and administer gently per package instructions.  If you still are having trouble with your bowel movements after that, please call the office for further instructions. °c. If you develop diarrhea or have many loose bowel movements, simplify your diet to bland foods & liquids for a few days.  Stop any stool softeners and decrease your fiber supplement.  Switching to mild  anti-diarrheal medications (Kayopectate, Pepto Bismol) can help.  If this worsens or does not improve, please call us. ° °5. Wound Care °a. Remove your bandages before your first bowel movement or 8 hours after surgery.     °b. Remove any wound packing material at this tim,e as well.  You do not need to repack the wound unless instructed otherwise.  Wear an absorbent pad or soft cotton gauze in your underwear to catch any drainage and help keep the area clean. You should change this every 2-3 hours while awake. °c. Keep the area clean and dry.  Bathe / shower every day, especially after bowel movements.  Keep the area clean by showering / bathing over the incision / wound.   It is okay to soak an open wound to help wash it.  Wet wipes or showers / gentle washing after bowel movements is often less traumatic than regular toilet paper. °d. You may have some styrofoam-like soft packing in the rectum which will come out with the first bowel movement.  °e. You will often notice bleeding with bowel movements.  This should slow down by the end of the first week of surgery °f. Expect some drainage.  This should slow down, too, by the end of the first week of surgery.  Wear an absorbent pad or soft cotton gauze in your underwear until the drainage stops. °g. Do Not sit on a rubber or pillow ring.  This can make you symptoms worse.  You may sit on a soft pillow if needed.  °6. ACTIVITIES as tolerated:   °a. You may resume regular (light) daily activities beginning the next day--such as daily self-care, walking, climbing stairs--gradually increasing activities as tolerated.  If you can walk 30 minutes without difficulty, it is safe to try more intense activity such as jogging, treadmill, bicycling, low-impact aerobics, swimming, etc. °b. Save the most intensive and strenuous activity for last such as sit-ups, heavy lifting, contact sports, etc  Refrain from any heavy lifting or straining until you are off narcotics for pain  control.   °c. You may drive when you are no longer taking prescription pain medication, you can comfortably sit for long periods of time, and you can safely maneuver your car and apply brakes. °d. You may have sexual intercourse when it is comfortable.  °7. FOLLOW UP in our office °a. Please call [facility] at [phone] to set up an appointment to see your surgeon in the office for a follow-up appointment approximately 3-4 weeks after your surgery. °b. Make sure that you call for this appointment the day you arrive home to insure a convenient appointment time. °10. IF YOU HAVE DISABILITY OR FAMILY LEAVE FORMS, BRING THEM TO THE OFFICE FOR PROCESSING.  DO NOT GIVE THEM TO YOUR DOCTOR. ° ° ° ° °WHEN TO CALL US [phone]: °1. Poor pain control °2. Reactions / problems with new medications (rash/itching, nausea, etc)  °3. Fever over 101.5 F (38.5   C) °4. Inability to urinate °5. Nausea and/or vomiting °6. Worsening swelling or bruising °7. Continued bleeding from incision. °8. Increased pain, redness, or drainage from the incision ° °The clinic staff is available to answer your questions during regular business hours (8:30am-5pm).  Please don’t hesitate to call and ask to speak to one of our nurses for clinical concerns.   A surgeon from [facility] is always on call at the hospitals °  °If you have a medical emergency, go to the nearest emergency room or call 911. °  ° °[facility] °[address], [address], [place], [place]  [address] ? °MAIN: [phone] ? TOLL FREE: [phone] ? °FAX [phone] °[email or web address] ° ° ° °Post Anesthesia Home Care Instructions ° °Activity: °Get plenty of rest for the remainder of the day. A responsible adult should stay with you for 24 hours following the procedure.  °For the next 24 hours, DO NOT: °-Drive a car °-Operate machinery °-Drink alcoholic beverages °-Take any medication unless instructed by your physician °-Make  any legal decisions or sign important papers. ° °Meals: °Start with liquid foods such as gelatin or soup. Progress to regular foods as tolerated. Avoid greasy, spicy, heavy foods. If nausea and/or vomiting occur, drink only clear liquids until the nausea and/or vomiting subsides. Call your physician if vomiting continues. ° °Special Instructions/Symptoms: °Your throat may feel dry or sore from the anesthesia or the breathing tube placed in your throat during surgery. If this causes discomfort, gargle with warm salt water. The discomfort should disappear within 24 hours. ° °

## 2013-05-19 NOTE — Progress Notes (Signed)
MRI reviewed. A shallow fistula was noted. Recent colonoscopy also reviewed and was without findings significant to this procedure.  Risk of surgery explained to the patient. Main risk include bleeding, infection and recurrence. If we do perform a fistulotomy there is a small risk of incontinence. I believe the patient understands these risks and has agreed to proceed with surgery.

## 2013-05-19 NOTE — Op Note (Signed)
05/19/2013  2:52 PM  PATIENT:  Daniel [name]  58 y.o. male  Patient Care Team: Marshia Lyhase A Michaels, PA-C as PCP - General (General Practice)  PRE-OPERATIVE DIAGNOSIS:  anal fistula    POST-OPERATIVE DIAGNOSIS:  anal fistula x2  PROCEDURE: RECTAL EXAM UNDER ANESTHESIA  anal fistulotmy x2  SURGEON:  Surgeon(s): Romie LeveeAlicia Ymani Porcher, MD  ASSISTANT: none   ANESTHESIA:   local and MAC  SPECIMEN:  No Specimen  DISPOSITION OF SPECIMEN:  N/A  COUNTS:  YES  PLAN OF CARE: Discharge to home after PACU  PATIENT DISPOSITION:  PACU - hemodynamically stable.  INDICATION: This is a [age] y.o. M with rectal drainage for several years.  MRI showed an obvious fistula.  He is here today for fistulotomy   OR FINDINGS: anorectal fistula x2, L ant region and R lateral region, significant sphincter hypertension  DESCRIPTION: the patient was identified in the preoperative holding area and taken to the OR where they were laid on the operating room table.  MAC anesthesia was induced without difficulty. The patient was then positioned in prone jackknife position with buttocks gently taped apart.  The patient was then prepped and draped in usual sterile fashion.  SCDs were noted to be in place prior to the initiation of anesthesia. A surgical timeout was performed indicating the correct patient, procedure, positioning and need for preoperative antibiotics.  I placed a rectal block using Marcaine with epi 1/2%.  I began with a digital rectal exam.   I then placed a Hill-Ferguson anoscope into the anal canal and evaluated this completely.  There was a mass in the L anterior region and a obvious external opening in the R lateral position.  I placed an S shaped fistula probe in the R lateral opening.  This exited in a radial fashion at the dentate line.  There only appeared to be a small amount of internal sphincter involved.  There did not appear to be any external sphincter involvement.  A fistulotomy was performed.   The edges were marsupialized with a running 2-0 Chromic suture.  I then identified the L anterior internal opening.  My fistula probe easily exited into the internal canal, significantly above the dentate line.  There appeared to be no external sphincter involvement.  There was only a small amount of internal sphincter involved.  Another fistulotomy was performed using cautery.  The edges were marsupialized with a running 2-0 chromic suture.  Hemostasis was good.  There were no other abnormalities found.  Lidocaine ointment and a sterile dressing were applied.  The patient was awakened from anesthesia and sent to the PACU in stable condition.  All counts were correct per OR staff.

## 2013-05-19 NOTE — Anesthesia Postprocedure Evaluation (Signed)
  Anesthesia Post-op Note  Patient: Daniel [name]  Procedure(s) Performed: Procedure(s) (LRB): RECTAL EXAM UNDER ANESTHESIA  (N/A) anal fistulotmy  (N/A)  Patient Location: PACU  Anesthesia Type: MAC  Level of Consciousness: awake and alert   Airway and Oxygen Therapy: Patient Spontanous Breathing  Post-op Pain: mild  Post-op Assessment: Post-op Vital signs reviewed, Patient's Cardiovascular Status Stable, Respiratory Function Stable, Patent Airway and No signs of Nausea or vomiting  Last Vitals:  Filed Vitals:   05/19/13 1515  BP: 127/79  Pulse: 75  Temp:   Resp: 19    Post-op Vital Signs: stable   Complications: No apparent anesthesia complications

## 2013-05-19 NOTE — Anesthesia Preprocedure Evaluation (Signed)
Anesthesia Evaluation  Patient identified by MRN, date of birth, ID band Patient awake    Reviewed: Allergy & Precautions, H&P , NPO status , Patient's Chart, lab work & pertinent test results  Airway Mallampati: II TM Distance: >3 FB Neck ROM: Full    Dental no notable dental hx.    Pulmonary neg pulmonary ROS, former smoker,  breath sounds clear to auscultation  Pulmonary exam normal       Cardiovascular hypertension, Pt. on medications Rhythm:Regular Rate:Normal     Neuro/Psych negative neurological ROS  negative psych ROS   GI/Hepatic negative GI ROS, Neg liver ROS,   Endo/Other  negative endocrine ROS  Renal/GU negative Renal ROS  negative genitourinary   Musculoskeletal negative musculoskeletal ROS (+)   Abdominal   Peds negative pediatric ROS (+)  Hematology negative hematology ROS (+)   Anesthesia Other Findings   Reproductive/Obstetrics negative OB ROS                           Anesthesia Physical Anesthesia Plan  ASA: II  Anesthesia Plan: MAC   Post-op Pain Management:    Induction:   Airway Management Planned: Simple Face Mask  Additional Equipment:   Intra-op Plan:   Post-operative Plan:   Informed Consent: I have reviewed the patients History and Physical, chart, labs and discussed the procedure including the risks, benefits and alternatives for the proposed anesthesia with the patient or authorized representative who has indicated his/her understanding and acceptance.   Dental advisory given  Plan Discussed with: CRNA  Anesthesia Plan Comments:         Anesthesia Quick Evaluation

## 2013-05-19 NOTE — H&P (View-Only) (Signed)
Chief Complaint  Patient presents with  . New Evaluation    eval for rectal drainage hx of rectal abscess    HISTORY: Daniel [name] is a 58 y.o. male who presents to the office with rectal drainage.  Other symptoms include constipation.  This had been occurring since 2009 after a perianal abscess.    It is intermittent in nature.  His bowel habits are irregular and his bowel movements are usually soft.  His fiber intake is minimal.  His last colonoscopy was in 2002 and is scheduled for a repeat 4/22.    Past Medical History  Diagnosis Date  . Hypertension       Past Surgical History  Procedure Laterality Date  . Perianal abscess  2009    Dr. Dwain SarnaWakefield removed        Current Outpatient Prescriptions  Medication Sig Dispense Refill  . lisinopril (PRINIVIL,ZESTRIL) 10 MG tablet Take 10 mg by mouth daily.       No current facility-administered medications for this visit.      Allergies  Allergen Reactions  . Prednisone Other (See Comments)    Rectal bleeding along with costipation      Family History  Problem Relation Age of Onset  . Cancer Father     liver    History   Social History  . Marital Status: Married    Spouse Name: N/A    Number of Children: N/A  . Years of Education: N/A   Social History Main Topics  . Smoking status: Former Games developermoker  . Smokeless tobacco: Never Used  . Alcohol Use: No  . Drug Use: No  . Sexual Activity: None   Other Topics Concern  . None   Social History Narrative  . None      REVIEW OF SYSTEMS - PERTINENT POSITIVES ONLY: Review of Systems - General ROS: negative for - chills, fever or weight loss Hematological and Lymphatic ROS: negative for - bleeding problems, blood clots or bruising Respiratory ROS: no cough, shortness of breath, or wheezing Cardiovascular ROS: no chest pain or dyspnea on exertion Gastrointestinal ROS: positive for - abdominal pain negative for - blood in stools, change in bowel habits or  nausea/vomiting Genito-Urinary ROS: no dysuria, trouble voiding, or hematuria  EXAM: Filed Vitals:   05/04/13 1115  BP: 120/80  Pulse: 120  Temp: 98 F (36.7 C)  Resp: 14    General appearance: alert and cooperative Resp: clear to auscultation bilaterally Cardio: regular rate and rhythm GI: normal findings: soft, non-tender   Procedure: Anoscopy Surgeon: Maisie Fushomas Diagnosis: rectal drainage  Assistant: Molli PoseyKendrickson After the risks and benefits were explained, verbal consent was obtained for above procedure  Anesthesia: none Findings: inflamed anal canal, mucus filled stool in rectal vault, L lateral perianal purulent drainage noted, palpable fluctuant mass at anal verge    ASSESSMENT AND PLAN:  Daniel [name] Is a [age] year old male who presents to the office with approximately 7 years of intermittent rectal drainage. Physical exam indicates an anal fistula. He has had a pelvic MRI performed for this. We will get these results from Dr. Wynelle BeckmannGillis. He is also scheduled to have a colonoscopy done next week, which I think is a good idea given his anal canal inflammation. His anorectal exam is concerning for possible perianal Crohn's disease. I've recommended an exam under anesthesia with possible biopsy and possible fistulotomy versus seton placement.  I've asked them to have Dr. Wynelle BeckmannGillis send us a copy of the colonoscopy report as well.  Jadyn Barge C Sokha Craker, MD Colon and Rectal Surgery / General Surgery [facility]      Visit Diagnoses: 1. Anal fistula     Primary Care Physician: No PCP Per Patient  

## 2013-05-24 ENCOUNTER — Encounter (HOSPITAL_BASED_OUTPATIENT_CLINIC_OR_DEPARTMENT_OTHER): Payer: Self-pay | Admitting: General Surgery

## 2013-05-27 ENCOUNTER — Encounter (INDEPENDENT_AMBULATORY_CARE_PROVIDER_SITE_OTHER): Payer: Self-pay

## 2013-06-03 ENCOUNTER — Encounter (INDEPENDENT_AMBULATORY_CARE_PROVIDER_SITE_OTHER): Payer: Self-pay

## 2013-06-15 ENCOUNTER — Encounter (INDEPENDENT_AMBULATORY_CARE_PROVIDER_SITE_OTHER): Payer: No Typology Code available for payment source | Admitting: General Surgery

## 2013-07-06 ENCOUNTER — Encounter (INDEPENDENT_AMBULATORY_CARE_PROVIDER_SITE_OTHER): Payer: Self-pay | Admitting: General Surgery

## 2013-07-06 ENCOUNTER — Ambulatory Visit (INDEPENDENT_AMBULATORY_CARE_PROVIDER_SITE_OTHER): Payer: No Typology Code available for payment source | Admitting: General Surgery

## 2013-07-06 VITALS — BP 124/80 | HR 86 | Temp 97.5°F | Ht 68.0 in | Wt 211.0 lb

## 2013-07-06 DIAGNOSIS — Z9889 Other specified postprocedural states: Secondary | ICD-10-CM

## 2013-07-06 NOTE — Patient Instructions (Signed)

## 2013-07-06 NOTE — Progress Notes (Signed)
Daniel [name] is a 58 y.o. male who is status posHaroldine Lawst a fistulotomy on 05/19/13.  He has had a slow recovery.  He is still having some pain and bleeding with bowel movement. He has pain with sitting for long periods of time.  He is also experiencing some purulent drainage.  He denies any stool leakage.  Objective: Filed Vitals:   07/06/13 1019  BP: 124/80  Pulse: 86  Temp: 97.5 F (36.4 C)    General appearance: alert and cooperative GI: normal findings: soft, non-tender  Incision: external portion healing, pain on DRE with bloody drainage and sphincter htn   Assessment: s/p  Patient Active Problem List   Diagnosis Date Noted  . 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 Bilateral inguinal hernia 04/16/2013  . Umbilical hernia 04/16/2013    Plan: I have asked him to start a fiber supplement and drink plenty of liquids to help soften his stools.  Hopefully these internal areas will heal.  I will see him back in 4 weeks for re-evaluation.  We will plan on doing an anoscopy then if he is still having issues.      Vanita Panda.Mukesh Kornegay C Yoshi Mancillas, MD [facility] [place] [phone]   07/06/2013 10:37 AM

## 2013-07-27 ENCOUNTER — Encounter (INDEPENDENT_AMBULATORY_CARE_PROVIDER_SITE_OTHER): Payer: No Typology Code available for payment source | Admitting: General Surgery

## 2013-07-27 ENCOUNTER — Telehealth (INDEPENDENT_AMBULATORY_CARE_PROVIDER_SITE_OTHER): Payer: Self-pay

## 2013-07-27 NOTE — Telephone Encounter (Signed)
Called pt to make him aware that he is scheduled for an appt on next Tuesday 7/14 @ 9:55. Pt voiced understanding and will be here.

## 2013-08-03 ENCOUNTER — Ambulatory Visit (INDEPENDENT_AMBULATORY_CARE_PROVIDER_SITE_OTHER): Payer: No Typology Code available for payment source | Admitting: General Surgery

## 2013-08-03 ENCOUNTER — Encounter (INDEPENDENT_AMBULATORY_CARE_PROVIDER_SITE_OTHER): Payer: Self-pay | Admitting: General Surgery

## 2013-08-03 VITALS — BP 133/84 | HR 88 | Temp 98.7°F | Resp 21 | Ht 68.0 in | Wt 214.2 lb

## 2013-08-03 DIAGNOSIS — Z9889 Other specified postprocedural states: Secondary | ICD-10-CM

## 2013-08-03 NOTE — Patient Instructions (Signed)
Return to the office as needed 

## 2013-08-03 NOTE — Progress Notes (Signed)
Daniel [name] is a 58 y.o. male who is status post a fistulotomy on 05/19/13. He has had a slow recovery. He is still having some occasional achy pain but denies any bleeding or sharp pain with bowel movement.  He not having any purulent drainage. He is having some occasional fecal drainage. He has good control of flatus, liquid and solid stool.  He is trying to add in more fiber to his diet.  Objective:  Filed Vitals:   08/03/13 0952  BP: 133/84  Pulse: 88  Temp: 98.7 F (37.1 C)  Resp: 21    General appearance: alert and cooperative  GI: normal findings: soft, non-tender  Incision: external portion healing, pain on DRE with inflammation noted ~6cm from anal verge, otherwise no other issues noted   Assessment:  s/p  Patient Active Problem List    Diagnosis  Date Noted   .  Perianal fistula  04/16/2013   .  Bilateral inguinal hernia  04/16/2013   .  Umbilical hernia  04/16/2013    Plan:  Doing better.  I think his slow recovery is related to the chronicity of his problem.  I expect this to continue to improve.  He will call me if these problems persist over the next few months.

## 2016-07-22 ENCOUNTER — Encounter (HOSPITAL_BASED_OUTPATIENT_CLINIC_OR_DEPARTMENT_OTHER): Payer: Self-pay | Admitting: *Deleted

## 2016-07-22 ENCOUNTER — Emergency Department (HOSPITAL_BASED_OUTPATIENT_CLINIC_OR_DEPARTMENT_OTHER): Payer: BLUE CROSS/BLUE SHIELD

## 2016-07-22 ENCOUNTER — Emergency Department (HOSPITAL_BASED_OUTPATIENT_CLINIC_OR_DEPARTMENT_OTHER)
Admission: EM | Admit: 2016-07-22 | Discharge: 2016-07-22 | Disposition: A | Discharge status: Home / Self Care | Payer: BLUE CROSS/BLUE SHIELD | Attending: Emergency Medicine | Admitting: Emergency Medicine

## 2016-07-22 DIAGNOSIS — I10 Essential (primary) hypertension: Secondary | ICD-10-CM | POA: Diagnosis not present

## 2016-07-22 DIAGNOSIS — S81012A Laceration without foreign body, left knee, initial encounter: Secondary | ICD-10-CM | POA: Diagnosis not present

## 2016-07-22 DIAGNOSIS — Y939 Activity, unspecified: Secondary | ICD-10-CM | POA: Diagnosis not present

## 2016-07-22 DIAGNOSIS — Y999 Unspecified external cause status: Secondary | ICD-10-CM | POA: Insufficient documentation

## 2016-07-22 DIAGNOSIS — Y929 Unspecified place or not applicable: Secondary | ICD-10-CM | POA: Diagnosis not present

## 2016-07-22 DIAGNOSIS — Z87891 Personal history of nicotine dependence: Secondary | ICD-10-CM | POA: Diagnosis not present

## 2016-07-22 DIAGNOSIS — W293XXA Contact with powered garden and outdoor hand tools and machinery, initial encounter: Secondary | ICD-10-CM | POA: Diagnosis not present

## 2016-07-22 DIAGNOSIS — S8992XA Unspecified injury of left lower leg, initial encounter: Secondary | ICD-10-CM | POA: Diagnosis present

## 2016-07-22 MED ORDER — LIDOCAINE-EPINEPHRINE (PF) 2 %-1:200000 IJ SOLN
10.0000 mL | Freq: Once | INTRAMUSCULAR | Status: AC
  Administered 2016-07-22: 10 mL
  Filled 2016-07-22: qty 10

## 2016-07-22 MED ORDER — CEPHALEXIN 500 MG PO CAPS: 500.0000 mg | ORAL_CAPSULE | Freq: Four times a day (QID) | ORAL | 0 refills | Status: AC

## 2016-07-22 MED FILL — CEPHALEXIN 500 MG CAPSULE: 500 | 5 days supply | Qty: 20 | Fill #0

## 2016-07-22 NOTE — Discharge Instructions (Signed)
WOUND CARE Please have your stitches/staples removed in 10-14 or sooner if you have concerns. You may do this at any available urgent care or at your primary care doctor's office. Take the Keflex which an antibiotic to prevent infection. With a knee immobilizer and use the crutches to prevent opening of the stitches.  Keep area clean and dry for 24 hours. Do not remove bandage, if applied.  After 24 hours, remove bandage and wash wound gently with mild soap and warm water. Reapply a new bandage after cleaning wound, if directed.  Continue daily cleansing with soap and water until stitches/staples are removed.  Do not apply any ointments or creams to the wound while stitches/staples are in place, as this may cause delayed healing.  Seek medical careif you experience any of the following signs of infection: Swelling, redness, pus drainage, streaking, fever >101.0 F  Seek care if you experience excessive bleeding that does not stop after 15-20 minutes of constant, firm pressure.

## 2016-07-22 NOTE — ED Notes (Signed)
Left knee has two lacerations: upper laceration has 7 stitches and the lower laceration has 6 stitches.

## 2016-07-22 NOTE — ED Provider Notes (Signed)
[facility] Note   CSN: [id number] Arrival date & time: 07/22/16  1108     History   Chief Complaint Chief Complaint  Patient presents with  . Laceration    HPI Daniel [name] is a 61 y.o. male.  HPI 61 year old Caucasian male with no significant past medical history presents to the ED today with laceration to his left knee. Patient states that he was cutting a tree with a chain saw when it kicked back hitting his left knee. He has been ambulatory since the event. Can bend his leg. Bleeding is controlled. Denies any weakness or paresthesias. Tetanus is up-to-date. Palpation and range of motion makes his pain worse. Nothing makes pain better. Past Medical History:  Diagnosis Date  . At risk for sleep apnea    STOP-BANG=  4   SENT TO PCP 05-18-2013  . Bilateral inguinal hernia   . Hypertension   . Perianal fistula   . Umbilical hernia     Patient Active Problem List   Diagnosis Date Noted  . Bilateral inguinal hernia 04/16/2013  . Umbilical hernia 04/16/2013    Past Surgical History:  Procedure Laterality Date  . COLONOSCOPY WITH PROPOFOL  05-12-2013  . INCISION AND DRAINAGE PERIRECTAL ABSCESS  2009  . NASAL SINUS SURGERY  TEEN  . PLACEMENT OF SETON N/A 05/19/2013   Procedure: anal fistulotmy ;  Surgeon: Romie Levee, MD;  Location: [facility];  Service: General;  Laterality: N/A;  . RECTAL EXAM UNDER ANESTHESIA N/A 05/19/2013   Procedure: RECTAL EXAM UNDER ANESTHESIA ;  Surgeon: Romie Levee, MD;  Location: [facility];  Service: General;  Laterality: N/A;       Home Medications    Prior to Admission medications   Medication Sig Start Date End Date Taking? Authorizing Provider  cephALEXin (KEFLEX) 500 MG capsule Take 1 capsule (500 mg total) by mouth 4 (four) times daily. 07/22/16   Rise Mu, PA-C  lisinopril (PRINIVIL,ZESTRIL) 10 MG tablet Take 10 mg by mouth every morning.     [provider]     Family History Family History  Problem Relation Age of Onset  . Cancer Father        liver    Social History Social History  Substance Use Topics  . Smoking status: Former Smoker    Years: 2.00    Types: Cigarettes  . Smokeless tobacco: Never Used     Comment: pt states smoked couple years as teen  . Alcohol use No     Allergies   Prednisone   Review of Systems Review of Systems  Constitutional: Negative for chills and fever.  Gastrointestinal: Negative for nausea and vomiting.  Musculoskeletal: Positive for arthralgias.  Skin: Positive for wound.  Neurological: Negative for weakness and numbness.     Physical Exam Updated Vital Signs BP 109/73 (BP Location: Left Arm)   Pulse 68   Temp 98.2 F (36.8 C) (Oral)   Resp 18   Ht 5' 8.5" (1.74 m)   Wt 83 kg (183 lb)   SpO2 98%   BMI 27.42 kg/m   Physical Exam  Constitutional: He appears well-developed and well-nourished. No distress.  HENT:  Head: Normocephalic and atraumatic.  Eyes: Right eye exhibits no discharge. Left eye exhibits no discharge. No scleral icterus.  Neck: Normal range of motion.  Cardiovascular: Intact distal pulses.   Pulmonary/Chest: No respiratory distress.  Musculoskeletal: Normal range of motion.  Patient with 2 lacerations. A 3  cm in length over the left patellar region. Superficial. Bleeding controlled. No exposed tendon or bone. Patient will full range of motion of the left knee. Strength is normal. Sensation intact. Cap refill normal. DP pulses are 2+ bilaterally. No abnormal joint movement.  Neurological: He is alert.  Skin: Skin is warm and dry. Capillary refill takes less than 2 seconds. No pallor.  Psychiatric: His behavior is normal. Judgment and thought content normal.  Nursing note and vitals reviewed.    ED Treatments / Results  Labs (all labs ordered are listed, but only abnormal results are displayed) Labs Reviewed - No data to display  EKG  EKG  Interpretation None       Radiology Dg Knee Complete 4 Views Left  Result Date: 07/22/2016 CLINICAL DATA:  Laceration with chain saw blade EXAM: LEFT KNEE - COMPLETE 4+ VIEW COMPARISON:  None. FINDINGS: Frontal, lateral, and bilateral oblique views were obtained. There is laceration inferior to the patella with air in the soft tissues. No radiopaque foreign body. No fracture or dislocation. No appreciable joint effusion. The joint spaces appear normal. There is a minimal spur along the posterior, superior patella. IMPRESSION: Soft tissue injury distal to the patella without radiopaque foreign body. No fracture or dislocation. No joint effusion. Minimal spurring along the posterior patella. No appreciable joint space narrowing or erosion. Electronically Signed   By: Bretta Bang III M.D.   On: 07/22/2016 11:52    Procedures Procedures (including critical care time) LACERATION REPAIR Performed by: Demetrios Loll Authorized by: Demetrios Loll Consent: Verbal consent obtained. Risks and benefits: risks, benefits and alternatives were discussed Consent given by: patient Patient identity confirmed: provided demographic data Prepped and Draped in normal sterile fashion Wound explored  Laceration Location: Left knee patella  Laceration Length: 3 cm  No Foreign Bodies seen or palpated  Anesthesia: local infiltration  Local anesthetic: lidocaine 1% with epinephrine  Anesthetic total: 6 ml  Irrigation method: syringe Amount of cleaning: extensive with Betadine and saline   Skin closure: Simple   Number of sutures: 13   Technique: Horizontal mattress   Patient tolerance: Patient tolerated the procedure well with no immediate complications.  Medications Ordered in ED Medications  lidocaine-EPINEPHrine (XYLOCAINE W/EPI) 2 %-1:200000 (PF) injection 10 mL (10 mLs Infiltration Given by Other 07/22/16 1354)     Initial Impression / Assessment and Plan / ED Course  I have  reviewed the triage vital signs and the nursing notes.  Pertinent labs & imaging results that were available during my care of the patient were reviewed by me and considered in my medical decision making (see chart for details).     Tdap is utd. Pressure irrigation performed. Laceration occurred < 8 hours prior to repair which was well tolerated. Pt has no co morbidities to effect normal wound healing. Discussed suture home care w pt and answered questions. Patient given knee immobilizer and crutches given location of laceration. Will start patient on prophylactic antibiotics given mechanism of laceration. Pt to f-u for wound check and suture removal in 10-14 days. Pt is hemodynamically stable w no complaints prior to dc.     Final Clinical Impressions(s) / ED Diagnoses   Final diagnoses:  Knee laceration, left, initial encounter    New Prescriptions Discharge Medication List as of 07/22/2016  2:02 PM    START taking these medications   Details  cephALEXin (KEFLEX) 500 MG capsule Take 1 capsule (500 mg total) by mouth 4 (four) times daily., Starting Mon 07/22/2016,  Print         Wallace KellerLeaphart, Kenneth T, PA-C 07/22/16 1812    Doug SouJacubowitz, Sam, MD 07/24/16 [phone]

## 2016-07-22 NOTE — ED Notes (Signed)
ED Provider at bedside. 

## 2016-07-22 NOTE — ED Triage Notes (Signed)
He was cutting a tree and after he cut the chain saw off he hit his left knee with the blade. Bleeding controlled.

## 2017-03-25 ENCOUNTER — Other Ambulatory Visit: Payer: Self-pay | Admitting: General Surgery

## 2017-03-25 DIAGNOSIS — R1031 Right lower quadrant pain: Secondary | ICD-10-CM

## 2017-04-04 ENCOUNTER — Ambulatory Visit
Admission: RE | Admit: 2017-04-04 | Discharge: 2017-04-04 | Disposition: A | Discharge status: Home / Self Care | Payer: BLUE CROSS/BLUE SHIELD | Source: Ambulatory Visit | Attending: General Surgery | Admitting: General Surgery

## 2017-04-04 DIAGNOSIS — R1031 Right lower quadrant pain: Secondary | ICD-10-CM

## 2018-12-30 IMAGING — US US PELVIS LIMITED
1 series · 14 of 16 positions shown · non-contrast
Comparison: None.

CLINICAL DATA: Initial evaluation for right inguinal pain,
extending around the pelvis and hips into at..

EXAM:
LIMITED ULTRASOUND OF PELVIS
TECHNIQUE: Limited transabdominal ultrasound examination of the pelvis was
performed.

[Series 1: us pelvis limited · 0.07mm/px · 14 of 16 slices shown]
[im 1/16]
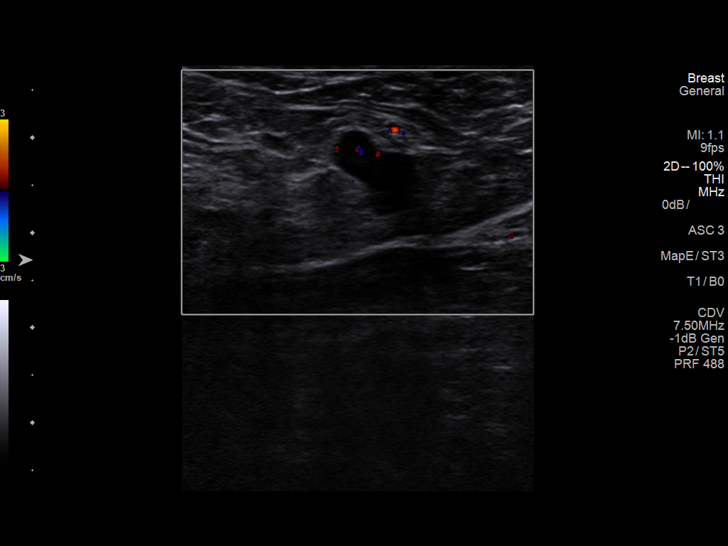
[im 2/16]
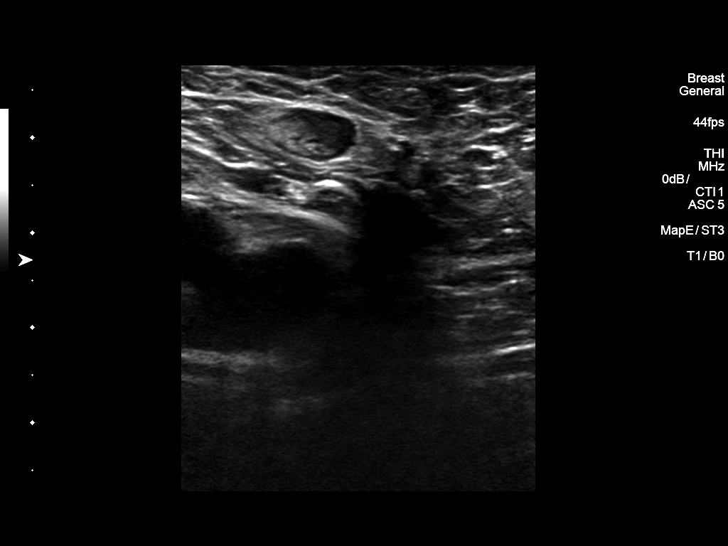
[im 3/16]
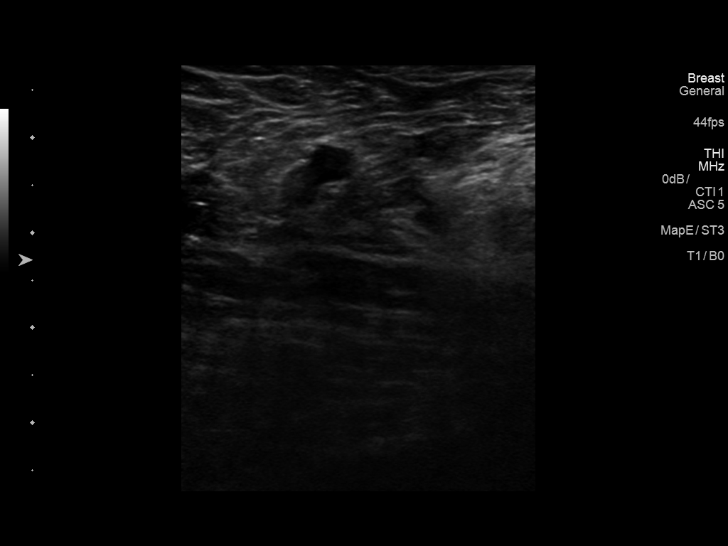
[im 5/16]
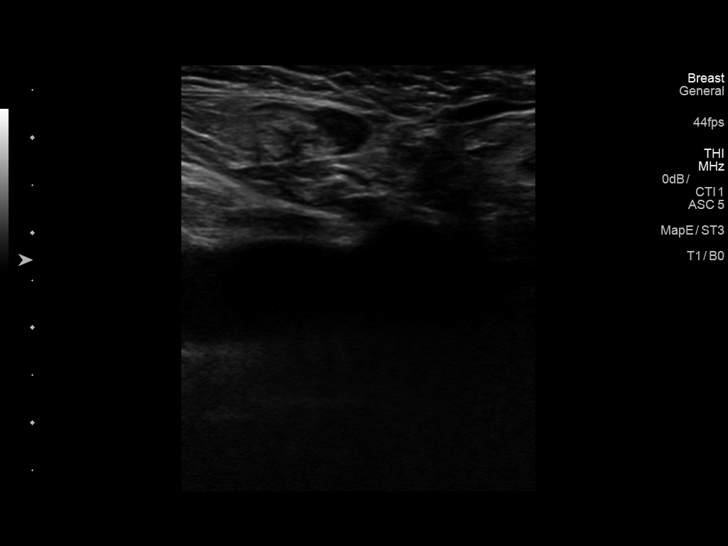
[im 6/16]
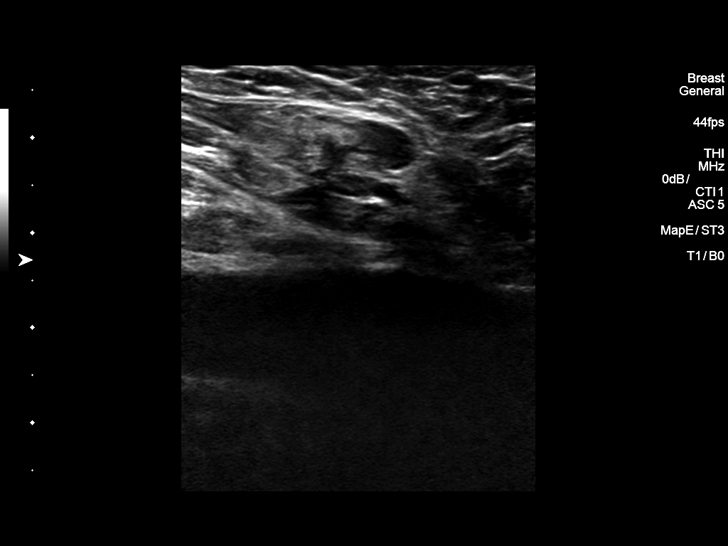
[im 7/16]
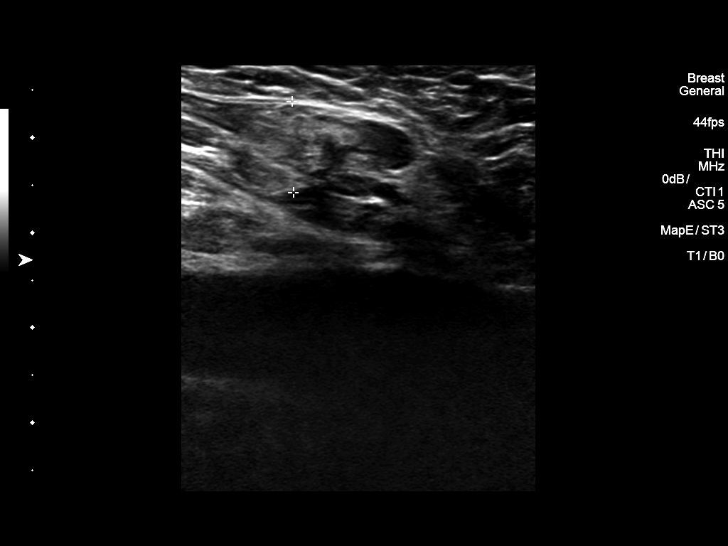
[im 8/16]
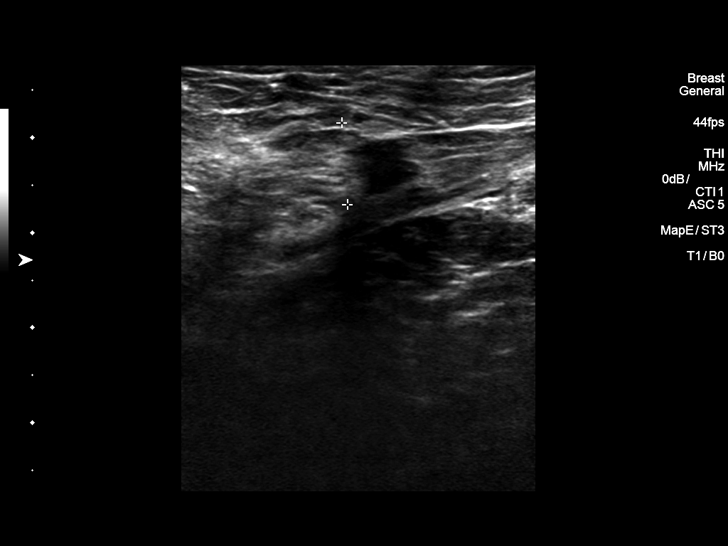
[im 9/16]
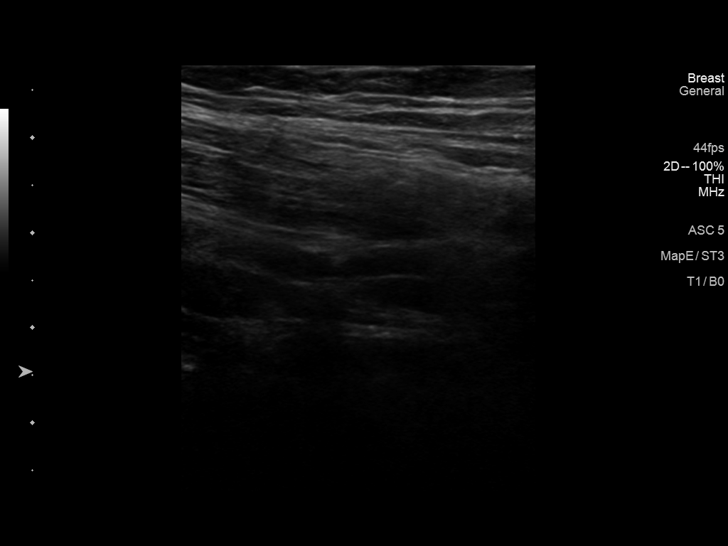
[im 10/16]
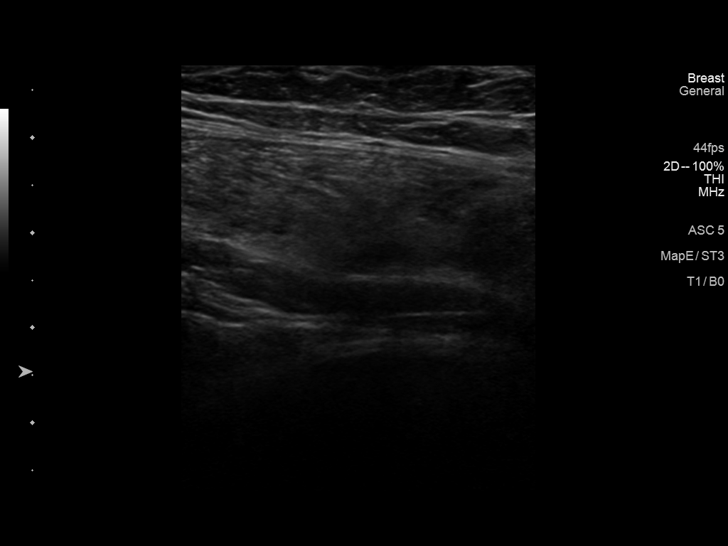
[im 11/16]
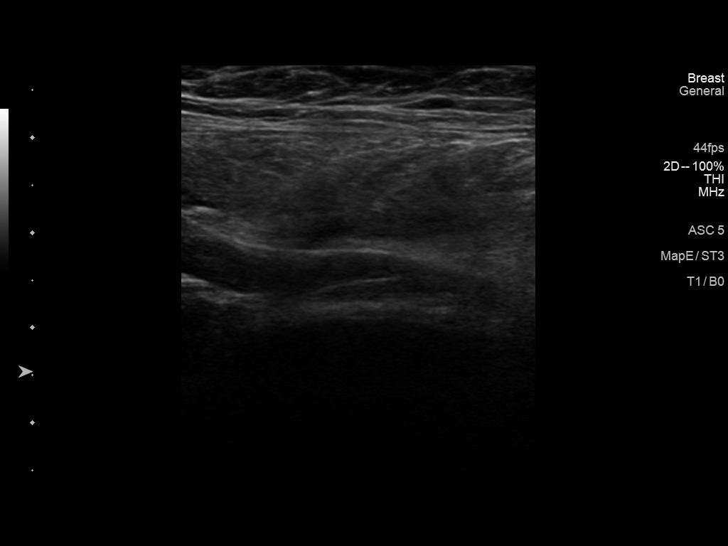
[im 13/16]
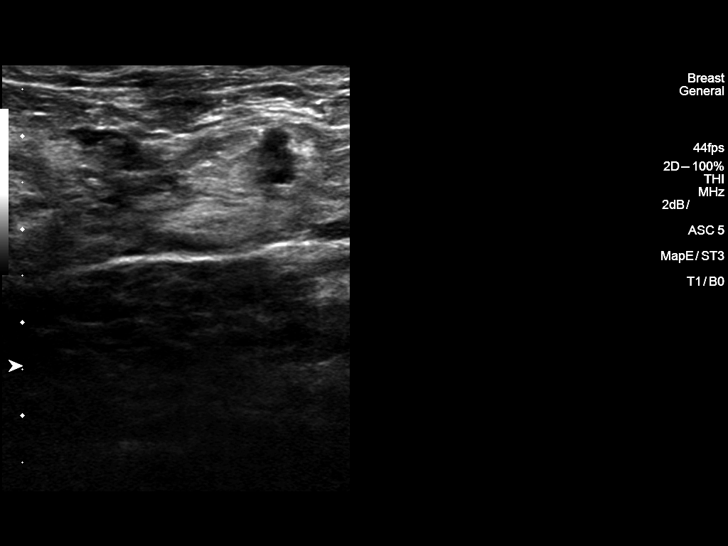
[im 14/16]
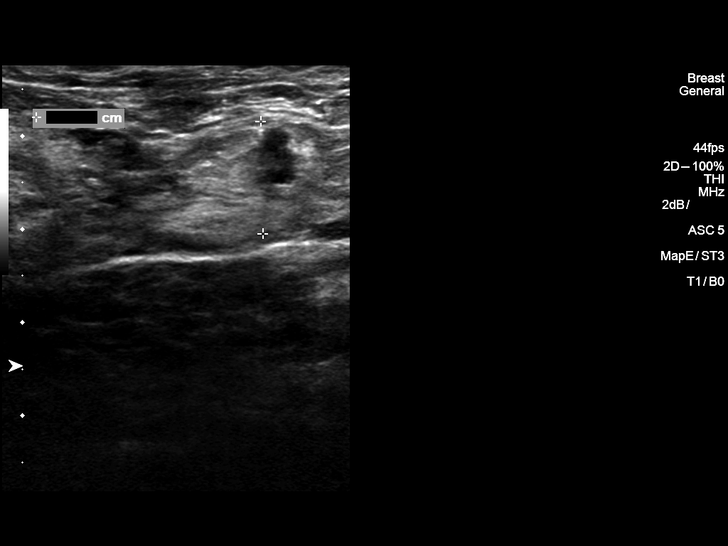
[im 15/16]
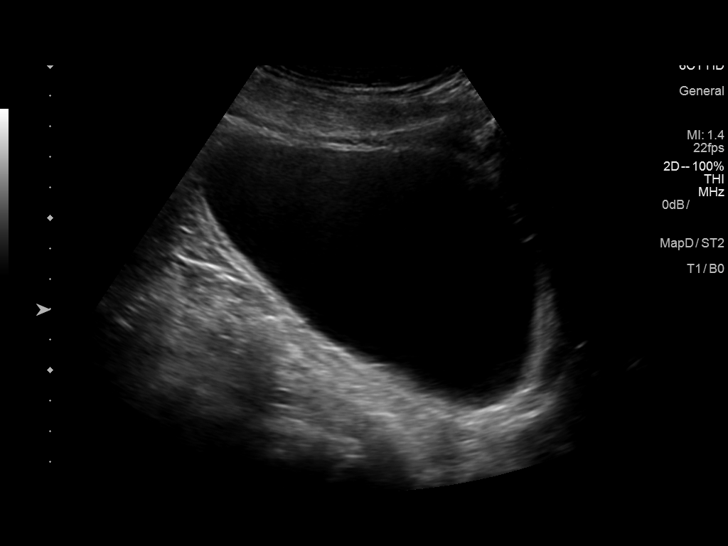
[im 16/16]
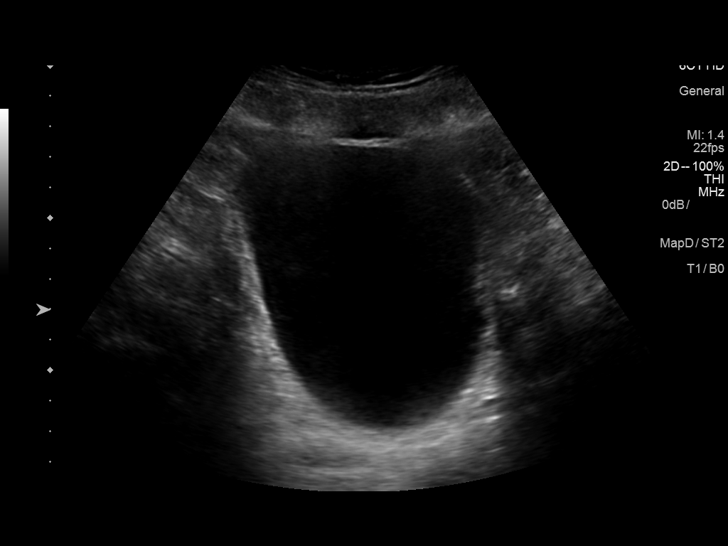

[14 of 16 positions shown; findings below may reference images not displayed]

FINDINGS: Targeted ultrasound of the right inguinal region demonstrates no
evidence for hernia. No mass lesion or loculated fluid collection.
Few mildly prominent lymph nodes measure up to approximately 1 cm,
within normal limits for size. No pathologically enlarged
adenopathy. Limited views of the bladder are grossly unremarkable.
IMPRESSION: Negative ultrasound of the right inguinal region with no evidence
for hernia or other abnormality.
